# Patient Record
Sex: Female | Born: 1965 | Race: White | Hispanic: No | Smoking: Never smoker
Health system: Southern US, Community
[De-identification: ages and names within clinical notes are randomized; demographics above are authoritative.]

## PROBLEM LIST (undated history)

## (undated) DIAGNOSIS — I10 Essential (primary) hypertension: Secondary | ICD-10-CM

## (undated) DIAGNOSIS — E039 Hypothyroidism, unspecified: Secondary | ICD-10-CM

## (undated) DIAGNOSIS — G4733 Obstructive sleep apnea (adult) (pediatric): Secondary | ICD-10-CM

## (undated) HISTORY — DX: Obstructive sleep apnea (adult) (pediatric): G47.33

## (undated) HISTORY — DX: Hypothyroidism, unspecified: E03.9

## (undated) HISTORY — DX: Essential (primary) hypertension: I10

---

## 1999-03-12 ENCOUNTER — Other Ambulatory Visit: Admission: RE | Admit: 1999-03-12 | Discharge: 1999-03-12 | Payer: Self-pay | Admitting: Obstetrics and Gynecology

## 1999-09-16 ENCOUNTER — Encounter: Payer: Self-pay | Admitting: Urology

## 1999-09-16 ENCOUNTER — Encounter: Admission: RE | Admit: 1999-09-16 | Discharge: 1999-09-16 | Payer: Self-pay | Admitting: Urology

## 2000-04-22 ENCOUNTER — Other Ambulatory Visit: Admission: RE | Admit: 2000-04-22 | Discharge: 2000-04-22 | Payer: Self-pay | Admitting: Obstetrics and Gynecology

## 2001-05-18 ENCOUNTER — Other Ambulatory Visit: Admission: RE | Admit: 2001-05-18 | Discharge: 2001-05-18 | Payer: Self-pay | Admitting: Obstetrics and Gynecology

## 2002-06-29 ENCOUNTER — Other Ambulatory Visit: Admission: RE | Admit: 2002-06-29 | Discharge: 2002-06-29 | Payer: Self-pay | Admitting: Obstetrics and Gynecology

## 2002-11-07 ENCOUNTER — Ambulatory Visit (HOSPITAL_BASED_OUTPATIENT_CLINIC_OR_DEPARTMENT_OTHER): Admission: RE | Admit: 2002-11-07 | Discharge: 2002-11-07 | Payer: Self-pay | Admitting: Family Medicine

## 2003-12-05 ENCOUNTER — Other Ambulatory Visit: Admission: RE | Admit: 2003-12-05 | Discharge: 2003-12-05 | Payer: Self-pay | Admitting: *Deleted

## 2004-04-03 ENCOUNTER — Ambulatory Visit (HOSPITAL_COMMUNITY): Admission: RE | Admit: 2004-04-03 | Discharge: 2004-04-03 | Payer: Self-pay | Admitting: Gastroenterology

## 2004-12-06 ENCOUNTER — Ambulatory Visit: Payer: Self-pay | Admitting: Hematology & Oncology

## 2004-12-09 ENCOUNTER — Other Ambulatory Visit: Admission: RE | Admit: 2004-12-09 | Discharge: 2004-12-09 | Payer: Self-pay | Admitting: *Deleted

## 2005-07-03 ENCOUNTER — Ambulatory Visit: Payer: Self-pay | Admitting: Hematology & Oncology

## 2005-12-03 ENCOUNTER — Other Ambulatory Visit: Admission: RE | Admit: 2005-12-03 | Discharge: 2005-12-03 | Payer: Self-pay | Admitting: *Deleted

## 2007-03-11 ENCOUNTER — Other Ambulatory Visit: Admission: RE | Admit: 2007-03-11 | Discharge: 2007-03-11 | Payer: Self-pay | Admitting: Family Medicine

## 2007-10-22 ENCOUNTER — Emergency Department (HOSPITAL_COMMUNITY): Admission: EM | Admit: 2007-10-22 | Discharge: 2007-10-22 | Payer: Self-pay | Admitting: Emergency Medicine

## 2008-03-21 ENCOUNTER — Other Ambulatory Visit: Admission: RE | Admit: 2008-03-21 | Discharge: 2008-03-21 | Payer: Self-pay | Admitting: Family Medicine

## 2009-04-05 ENCOUNTER — Other Ambulatory Visit: Admission: RE | Admit: 2009-04-05 | Discharge: 2009-04-05 | Payer: Self-pay | Admitting: Family Medicine

## 2009-04-30 ENCOUNTER — Encounter: Admission: RE | Admit: 2009-04-30 | Discharge: 2009-04-30 | Payer: Self-pay | Admitting: Gastroenterology

## 2009-05-08 ENCOUNTER — Ambulatory Visit (HOSPITAL_COMMUNITY): Admission: RE | Admit: 2009-05-08 | Discharge: 2009-05-08 | Payer: Self-pay | Admitting: Gastroenterology

## 2009-10-19 ENCOUNTER — Encounter: Admission: RE | Admit: 2009-10-19 | Discharge: 2009-10-19 | Payer: Self-pay | Admitting: Family Medicine

## 2010-04-16 ENCOUNTER — Other Ambulatory Visit: Admission: RE | Admit: 2010-04-16 | Discharge: 2010-04-16 | Payer: Self-pay | Admitting: Family Medicine

## 2010-04-26 ENCOUNTER — Encounter: Admission: RE | Admit: 2010-04-26 | Discharge: 2010-04-26 | Payer: Self-pay | Admitting: Family Medicine

## 2011-01-17 NOTE — Op Note (Signed)
Nancy Kim, Nancy Kim                           ACCOUNT NO.:  [id number]   MEDICAL RECORD NO.:  [id number]                   PATIENT TYPE:  AMB   LOCATION:  [facility]                                 FACILITY:  [facility]   PHYSICIAN:  Graylin Shiver, M.D.                DATE OF BIRTH:  11-08-1965   DATE OF PROCEDURE:  04/03/2004  DATE OF DISCHARGE:                                 OPERATIVE REPORT   PROCEDURE PERFORMED:  Colonoscopy.   INDICATIONS FOR PROCEDURE:  Constipation, rectal bleeding.   Informed consent was obtained after explanation of the risks of bleeding,  infection, and perforation.   PREMEDICATIONS:  Fentanyl 50 mcg  IV, Versed 5 mg IV.   DESCRIPTION OF PROCEDURE:  With the patient in the left lateral decubitus  position, a rectal exam was performed and no masses were felt.  The Olympus  colonoscope was inserted into the rectum and advanced around the colon to  the cecum.  Cecal landmarks were identified.  The cecum and ascending colon  were normal.  The transverse colon normal.  The descending colon, sigmoid  and rectum were normal.  The patient tolerated the procedure well without  complications.   IMPRESSION:  Normal colonoscopy to the cecum.                                               Graylin Shiver, M.D.    Nancy Kim  D:  04/03/2004  T:  04/03/2004  Job:  [id number]   cc:   Deboraha Sprang at [facility]

## 2011-03-20 ENCOUNTER — Other Ambulatory Visit: Payer: Self-pay | Admitting: Family Medicine

## 2011-03-20 DIAGNOSIS — E041 Nontoxic single thyroid nodule: Secondary | ICD-10-CM

## 2011-03-27 ENCOUNTER — Ambulatory Visit
Admission: RE | Admit: 2011-03-27 | Discharge: 2011-03-27 | Disposition: A | Discharge status: Home / Self Care | Payer: BC Managed Care – PPO | Source: Ambulatory Visit | Attending: Family Medicine | Admitting: Family Medicine

## 2011-03-27 DIAGNOSIS — E041 Nontoxic single thyroid nodule: Secondary | ICD-10-CM

## 2011-05-26 LAB — BASIC METABOLIC PANEL
CO2: 21
Calcium: 10.3
Creatinine, Ser: 1.19
Glucose, Bld: 121 — ABNORMAL HIGH
Sodium: 139

## 2011-05-26 LAB — URINE CULTURE: Colony Count: 15000

## 2011-05-26 LAB — URINE MICROSCOPIC-ADD ON

## 2011-05-26 LAB — URINALYSIS, ROUTINE W REFLEX MICROSCOPIC
Bilirubin Urine: NEGATIVE
Glucose, UA: NEGATIVE
Hgb urine dipstick: NEGATIVE
Nitrite: NEGATIVE
Protein, ur: 100 — AB
Specific Gravity, Urine: 1.014
Urobilinogen, UA: 1
pH: 7.5

## 2011-08-13 ENCOUNTER — Other Ambulatory Visit: Payer: Self-pay | Admitting: Family Medicine

## 2011-08-13 ENCOUNTER — Other Ambulatory Visit (HOSPITAL_COMMUNITY)
Admission: RE | Admit: 2011-08-13 | Discharge: 2011-08-13 | Disposition: A | Discharge status: Home / Self Care | Payer: BC Managed Care – PPO | Source: Ambulatory Visit | Attending: Family Medicine | Admitting: Family Medicine

## 2011-08-13 DIAGNOSIS — Z1159 Encounter for screening for other viral diseases: Secondary | ICD-10-CM | POA: Insufficient documentation

## 2011-08-13 DIAGNOSIS — Z124 Encounter for screening for malignant neoplasm of cervix: Secondary | ICD-10-CM | POA: Insufficient documentation

## 2014-04-18 ENCOUNTER — Other Ambulatory Visit: Payer: Self-pay | Admitting: Family Medicine

## 2014-04-18 ENCOUNTER — Other Ambulatory Visit (HOSPITAL_COMMUNITY)
Admission: RE | Admit: 2014-04-18 | Discharge: 2014-04-18 | Disposition: A | Discharge status: Home / Self Care | Payer: BC Managed Care – PPO | Source: Ambulatory Visit | Attending: Family Medicine | Admitting: Family Medicine

## 2014-04-18 DIAGNOSIS — Z124 Encounter for screening for malignant neoplasm of cervix: Secondary | ICD-10-CM | POA: Diagnosis not present

## 2014-04-18 DIAGNOSIS — Z1151 Encounter for screening for human papillomavirus (HPV): Secondary | ICD-10-CM | POA: Insufficient documentation

## 2014-04-19 LAB — CYTOLOGY - PAP

## 2014-04-20 ENCOUNTER — Other Ambulatory Visit: Payer: Self-pay | Admitting: Family Medicine

## 2014-04-20 DIAGNOSIS — E041 Nontoxic single thyroid nodule: Secondary | ICD-10-CM

## 2014-04-26 ENCOUNTER — Ambulatory Visit
Admission: RE | Admit: 2014-04-26 | Discharge: 2014-04-26 | Disposition: A | Discharge status: Home / Self Care | Payer: BC Managed Care – PPO | Source: Ambulatory Visit | Attending: Family Medicine | Admitting: Family Medicine

## 2014-04-26 ENCOUNTER — Encounter (INDEPENDENT_AMBULATORY_CARE_PROVIDER_SITE_OTHER): Payer: Self-pay

## 2014-04-26 DIAGNOSIS — E041 Nontoxic single thyroid nodule: Secondary | ICD-10-CM

## 2014-08-22 ENCOUNTER — Other Ambulatory Visit: Payer: Self-pay | Admitting: Endocrinology

## 2014-08-22 DIAGNOSIS — E041 Nontoxic single thyroid nodule: Secondary | ICD-10-CM

## 2015-04-09 ENCOUNTER — Ambulatory Visit
Admission: RE | Admit: 2015-04-09 | Discharge: 2015-04-09 | Disposition: A | Discharge status: Home / Self Care | Payer: BC Managed Care – PPO | Source: Ambulatory Visit | Attending: Endocrinology | Admitting: Endocrinology

## 2015-04-09 DIAGNOSIS — E041 Nontoxic single thyroid nodule: Secondary | ICD-10-CM

## 2016-10-22 ENCOUNTER — Other Ambulatory Visit: Payer: Self-pay | Admitting: Family Medicine

## 2016-10-22 ENCOUNTER — Other Ambulatory Visit (HOSPITAL_COMMUNITY)
Admission: RE | Admit: 2016-10-22 | Discharge: 2016-10-22 | Disposition: A | Discharge status: Home / Self Care | Payer: BC Managed Care – PPO | Source: Ambulatory Visit | Attending: Family Medicine | Admitting: Family Medicine

## 2016-10-22 DIAGNOSIS — Z124 Encounter for screening for malignant neoplasm of cervix: Secondary | ICD-10-CM | POA: Diagnosis present

## 2016-10-22 DIAGNOSIS — Z1151 Encounter for screening for human papillomavirus (HPV): Secondary | ICD-10-CM | POA: Insufficient documentation

## 2016-10-24 LAB — CYTOLOGY - PAP
DIAGNOSIS: NEGATIVE
HPV: NOT DETECTED

## 2019-10-29 ENCOUNTER — Ambulatory Visit: Payer: BC Managed Care – PPO | Attending: Internal Medicine

## 2019-10-29 DIAGNOSIS — Z23 Encounter for immunization: Secondary | ICD-10-CM | POA: Insufficient documentation

## 2019-10-29 NOTE — Progress Notes (Signed)
   Covid-19 Vaccination Clinic  Name:  Nancy Kim    MRN: [id number] DOB: 12-Jun-1966  10/29/2019  Ms. Nancy Kim was observed post Covid-19 immunization for 15 minutes without incidence. She was provided with Vaccine Information Sheet and instruction to access the V-Safe system.   Ms. Nancy Kim was instructed to call 911 with any severe reactions post vaccine: Marland Kitchen Difficulty breathing  . Swelling of your face and throat  . A fast heartbeat  . A bad rash all over your body  . Dizziness and weakness    Immunizations Administered    Name Date Dose VIS Date Route   Pfizer COVID-19 Vaccine 10/29/2019 12:45 PM 0.3 mL 08/12/2019 Intramuscular   Manufacturer: [employer], [employer]   Lot: [id number]   NDC: [address]-8

## 2019-11-19 ENCOUNTER — Ambulatory Visit: Payer: BC Managed Care – PPO | Attending: Internal Medicine

## 2019-11-19 DIAGNOSIS — Z23 Encounter for immunization: Secondary | ICD-10-CM

## 2019-11-19 NOTE — Progress Notes (Signed)
   Covid-19 Vaccination Clinic  Name:  Nancy Kim    MRN: [id number] DOB: [birth year]  11/19/2019  [name] was observed post Covid-19 immunization for 15 minutes without incident. She was provided with Vaccine Information Sheet and instruction to access the V-Safe system.   [name] was instructed to call 911 with any severe reactions post vaccine: Marland Kitchen Difficulty breathing  . Swelling of face and throat  . A fast heartbeat  . A bad rash all over body  . Dizziness and weakness   Immunizations Administered    Name Date Dose VIS Date Route   Pfizer COVID-19 Vaccine 11/19/2019  9:26 AM 0.3 mL 08/12/2019 Intramuscular   Manufacturer: [employer], [employer]   Lot: [id number]   NDC: [address]-7

## 2019-11-23 ENCOUNTER — Ambulatory Visit: Payer: BC Managed Care – PPO

## 2020-03-03 ENCOUNTER — Emergency Department (HOSPITAL_COMMUNITY)
Admission: EM | Admit: 2020-03-03 | Discharge: 2020-03-04 | Disposition: A | Discharge status: Home / Self Care | Payer: BC Managed Care – PPO | Attending: Emergency Medicine | Admitting: Emergency Medicine

## 2020-03-03 DIAGNOSIS — K529 Noninfective gastroenteritis and colitis, unspecified: Secondary | ICD-10-CM | POA: Diagnosis not present

## 2020-03-03 DIAGNOSIS — R112 Nausea with vomiting, unspecified: Secondary | ICD-10-CM | POA: Diagnosis present

## 2020-03-03 LAB — CBC WITH DIFFERENTIAL/PLATELET
Abs Immature Granulocytes: 0.32 10*3/uL — ABNORMAL HIGH (ref 0.00–0.07)
Basophils Absolute: 0.1 10*3/uL (ref 0.0–0.1)
Basophils Relative: 0 %
Eosinophils Absolute: 0.2 10*3/uL (ref 0.0–0.5)
Eosinophils Relative: 1 %
HCT: 52.4 % — ABNORMAL HIGH (ref 36.0–46.0)
Hemoglobin: 17.3 g/dL — ABNORMAL HIGH (ref 12.0–15.0)
Immature Granulocytes: 1 %
Lymphocytes Relative: 7 %
Lymphs Abs: 1.8 10*3/uL (ref 0.7–4.0)
MCH: 28.8 pg (ref 26.0–34.0)
MCHC: 33 g/dL (ref 30.0–36.0)
MCV: 87.3 fL (ref 80.0–100.0)
Monocytes Absolute: 0.9 10*3/uL (ref 0.1–1.0)
Monocytes Relative: 3 %
Neutro Abs: 24.4 10*3/uL — ABNORMAL HIGH (ref 1.7–7.7)
Neutrophils Relative %: 88 %
Platelets: 443 10*3/uL — ABNORMAL HIGH (ref 150–400)
RBC: 6 MIL/uL — ABNORMAL HIGH (ref 3.87–5.11)
RDW: 13 % (ref 11.5–15.5)
WBC: 27.7 10*3/uL — ABNORMAL HIGH (ref 4.0–10.5)
nRBC: 0 % (ref 0.0–0.2)

## 2020-03-03 LAB — COMPREHENSIVE METABOLIC PANEL
ALT: 17 U/L (ref 0–44)
AST: 28 U/L (ref 15–41)
Albumin: 4.3 g/dL (ref 3.5–5.0)
Alkaline Phosphatase: 85 U/L (ref 38–126)
Anion gap: 15 (ref 5–15)
BUN: 26 mg/dL — ABNORMAL HIGH (ref 6–20)
CO2: 22 mmol/L (ref 22–32)
Calcium: 9.6 mg/dL (ref 8.9–10.3)
Chloride: 102 mmol/L (ref 98–111)
Creatinine, Ser: 1.35 mg/dL — ABNORMAL HIGH (ref 0.44–1.00)
GFR calc Af Amer: 51 mL/min — ABNORMAL LOW (ref >60)
GFR calc non Af Amer: 44 mL/min — ABNORMAL LOW (ref >60)
Glucose, Bld: 194 mg/dL — ABNORMAL HIGH (ref 70–99)
Potassium: 4.1 mmol/L (ref 3.5–5.1)
Sodium: 139 mmol/L (ref 135–145)
Total Bilirubin: 1.6 mg/dL — ABNORMAL HIGH (ref 0.3–1.2)
Total Protein: 7.5 g/dL (ref 6.5–8.1)

## 2020-03-03 MED ORDER — SODIUM CHLORIDE 0.9 % IV BOLUS
1000.0000 mL | Freq: Once | INTRAVENOUS | Status: AC
  Administered 2020-03-03: 1000 mL via INTRAVENOUS

## 2020-03-03 MED ORDER — ONDANSETRON HCL 4 MG/2ML IJ SOLN
4.0000 mg | Freq: Once | INTRAMUSCULAR | Status: AC
  Administered 2020-03-03: 4 mg via INTRAVENOUS
  Filled 2020-03-03: qty 2

## 2020-03-03 NOTE — ED Provider Notes (Signed)
[facility]-EMERGENCY DEPT Provider Note   CSN: [id number] Arrival date & time: 03/03/20  2117     History No chief complaint on file.   Nancy Kim is a 54 y.o. female.  Patient presents to the emergency department with chief complaint of nausea, vomiting, and diarrhea.  She reports that the symptoms started suddenly today at around 7 PM.  She reports history of the same.  She denies any unusual oral intake.  She denies any fevers or chills.  She denies any recent illnesses or other associated symptoms or problems.  Denies any treatments prior to arrival.  No one else is sick at home.  She reports recent abx use for a dental procedure, but can't recall which one.  She does have amoxicillin allergy.  The history is provided by the patient. No language interpreter was used.       No past medical history on file.  There are no problems to display for this patient.    OB History   No obstetric history on file.     No family history on file.  Social History   Tobacco Use  . Smoking status: Not on file  Substance Use Topics  . Alcohol use: Not on file  . Drug use: Not on file    Home Medications Prior to Admission medications   Not on File    Allergies    Patient has no allergy information on record.  Review of Systems   Review of Systems  All other systems reviewed and are negative.   Physical Exam Updated Vital Signs BP 139/90 (BP Location: Right Arm)   Pulse 71   Temp (!) 97.5 F (36.4 C) (Oral)   Resp 20   Ht 5\' 4"  (1.626 m)   Wt 86.2 kg   SpO2 100%   BMI 32.61 kg/m   Physical Exam Vitals and nursing note reviewed.  Constitutional:      General: She is not in acute distress.    Appearance: She is well-developed.  HENT:     Head: Normocephalic and atraumatic.  Eyes:     Conjunctiva/sclera: Conjunctivae normal.  Cardiovascular:     Rate and Rhythm: Normal rate and regular rhythm.     Heart sounds: No murmur heard.    Pulmonary:     Effort: Pulmonary effort is normal. No respiratory distress.     Breath sounds: Normal breath sounds.  Abdominal:     Palpations: Abdomen is soft.     Tenderness: There is no abdominal tenderness.  Musculoskeletal:        General: Normal range of motion.     Cervical back: Neck supple.  Skin:    General: Skin is warm and dry.  Neurological:     Mental Status: She is alert and oriented to person, place, and time.  Psychiatric:        Mood and Affect: Mood normal.        Behavior: Behavior normal.     ED Results / Procedures / Treatments   Labs (all labs ordered are listed, but only abnormal results are displayed) Labs Reviewed  CBC WITH DIFFERENTIAL/PLATELET  COMPREHENSIVE METABOLIC PANEL    EKG None  Radiology No results found.  Procedures Procedures (including critical care time)  Medications Ordered in ED Medications  sodium chloride 0.9 % bolus 1,000 mL (has no administration in time range)  ondansetron (ZOFRAN) injection 4 mg (has no administration in time range)    ED Course  I have reviewed the triage vital signs and the nursing notes.  Pertinent labs & imaging results that were available during my care of the patient were reviewed by me and considered in my medical decision making (see chart for details).    MDM Rules/Calculators/A&P                          This patient complains of nausea, vomiting, and diarrhea, this involves an extensive number of treatment options, and is a complaint that carries with it a high risk of complications and morbidity.  The differential diagnosis includes viral illness, colitis, C. difficile given recent antibiotic use.  Pertinent Labs I ordered, reviewed, and interpreted labs, which included CBC notable for significant leukocytosis at 27.7, she also appears hemoconcentrated with hemoglobin of 17.3.  CMP notable for mildly elevated creatinine at 1.35, likely secondary to dehydration.  Potassium is  normal.  C. difficile is negative.  Imaging Interpretation I ordered imaging studies which included CT,  which showed evidence for enterocolitis..   Medications I ordered medication Zofran and fluids for nausea and dehydration.  Reassessments After the interventions stated above, I reevaluated the patient and found feeling improved after fluids and Zofran.  With reassuring work-up, will plan for discharge and outpatient follow-up.  Patient understands and agrees with plan.  Final Clinical Impression(s) / ED Diagnoses Final diagnoses:  Enterocolitis    Rx / DC Orders ED Discharge Orders    None       Roxy Horseman, PA-C 03/04/20 0556    Palumbo, April, MD 03/04/20 4497

## 2020-03-03 NOTE — ED Triage Notes (Signed)
Per EMS, Pt is coming from home. PT called out today due to sudden onset of N/V/D starting at 1900 today. Pt was found very pale, and diaphoretic. Pt has had these episode before but no explantation found.

## 2020-03-04 ENCOUNTER — Encounter (HOSPITAL_COMMUNITY): Payer: Self-pay

## 2020-03-04 ENCOUNTER — Emergency Department (HOSPITAL_COMMUNITY): Payer: BC Managed Care – PPO

## 2020-03-04 LAB — C DIFFICILE QUICK SCREEN W PCR REFLEX
C Diff antigen: NEGATIVE
C Diff interpretation: NOT DETECTED
C Diff toxin: NEGATIVE

## 2020-03-04 MED ORDER — SODIUM CHLORIDE (PF) 0.9 % IJ SOLN
INTRAMUSCULAR | Status: AC
  Filled 2020-03-04: qty 50

## 2020-03-04 MED ORDER — IOHEXOL 300 MG/ML  SOLN
100.0000 mL | Freq: Once | INTRAMUSCULAR | Status: AC | PRN
  Administered 2020-03-04: 80 mL via INTRAVENOUS

## 2020-03-04 MED ORDER — ONDANSETRON 4 MG PO TBDP: 4.0000 mg | ORAL_TABLET | Freq: Three times a day (TID) | ORAL | 0 refills | Status: DC | PRN

## 2020-03-20 ENCOUNTER — Other Ambulatory Visit (HOSPITAL_COMMUNITY)
Admission: RE | Admit: 2020-03-20 | Discharge: 2020-03-20 | Disposition: A | Discharge status: Home / Self Care | Payer: BC Managed Care – PPO | Source: Ambulatory Visit | Attending: Family Medicine | Admitting: Family Medicine

## 2020-03-20 DIAGNOSIS — Z124 Encounter for screening for malignant neoplasm of cervix: Secondary | ICD-10-CM | POA: Diagnosis present

## 2020-03-23 LAB — CYTOLOGY - PAP
Comment: NEGATIVE
Diagnosis: NEGATIVE
Diagnosis: REACTIVE
High risk HPV: NEGATIVE

## 2021-02-12 IMAGING — CT CT ABD-PELV W/ CM
2 of 5 series · 15 of 46 positions shown, 17 images · IV contrast (omnipaque)
Comparison: HIDA 05/08/2009, upper GI 02/28/2009

CLINICAL DATA: Sudden onset nausea, vomiting and diarrhea began at
6588 hours, pale and diaphoretic

EXAM:
CT ABDOMEN AND PELVIS WITH CONTRAST
TECHNIQUE: Multidetector CT imaging of the abdomen and pelvis was performed
using the standard protocol following bolus administration of
intravenous contrast.
CONTRAST:  80mL OMNIPAQUE IOHEXOL 300 MG/ML  SOLN

[Series 2: axial st · axial · 0.85mm/px · z∈[-494,-79]mm · 12 of 97 slices shown, 14 images]
[im 7/97  soft-tissue]
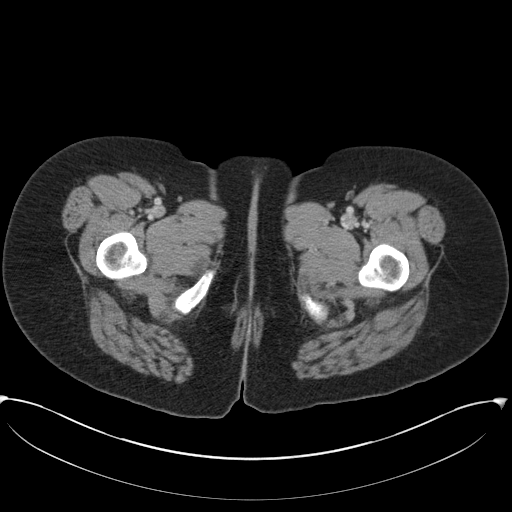
[im 7/97  bone]
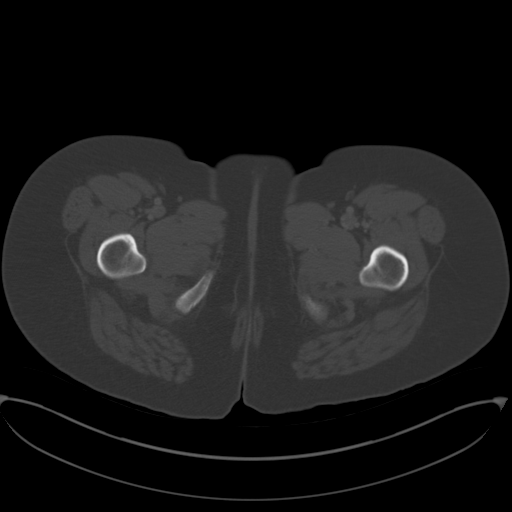
[im 14/97  soft-tissue]
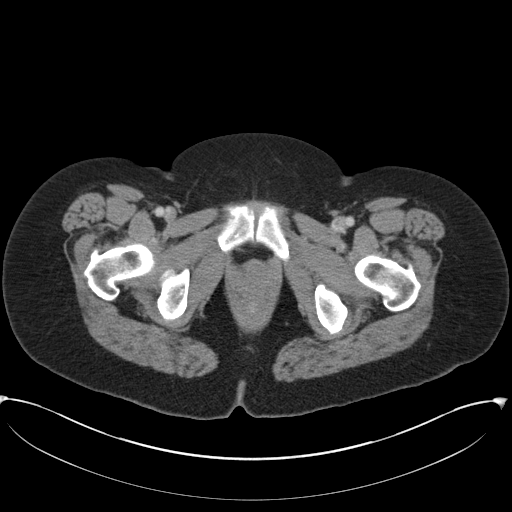
[im 21/97  soft-tissue]
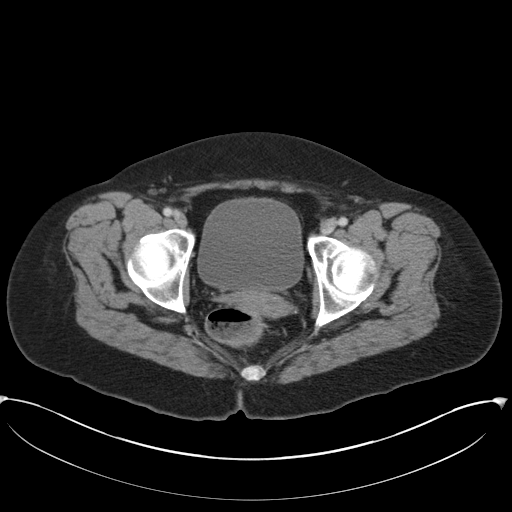
[im 28/97  soft-tissue]
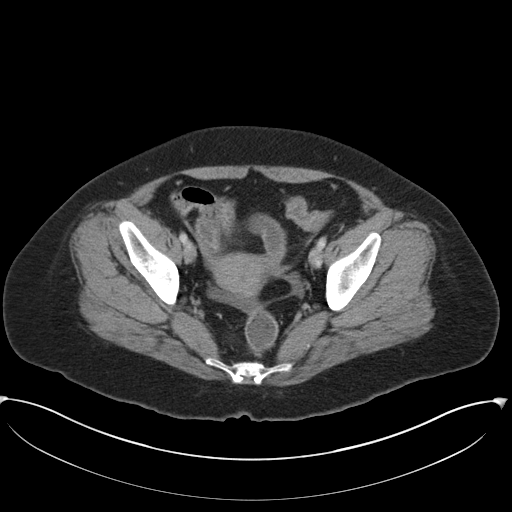
[im 35/97  soft-tissue]
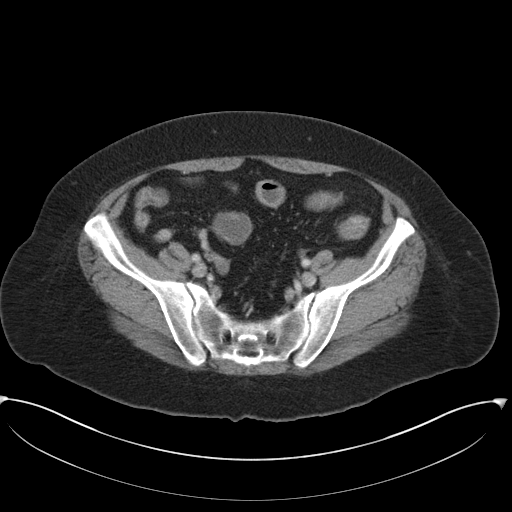
[im 42/97  soft-tissue]
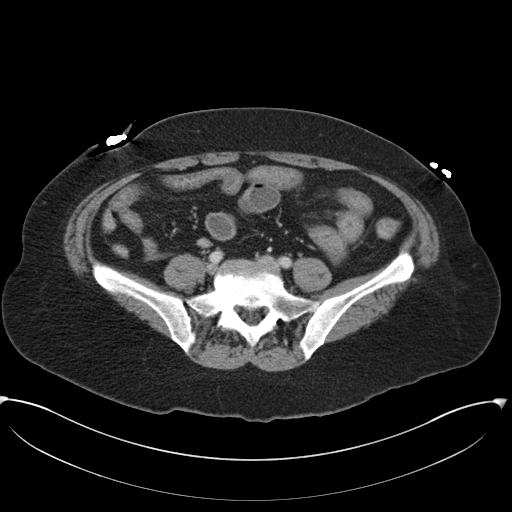
[im 55/97  soft-tissue]
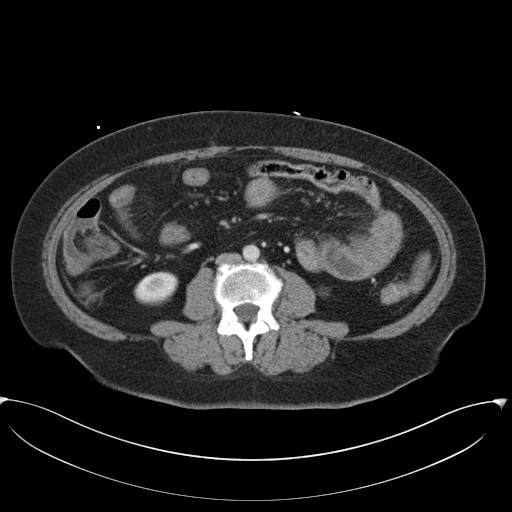
[im 62/97  soft-tissue]
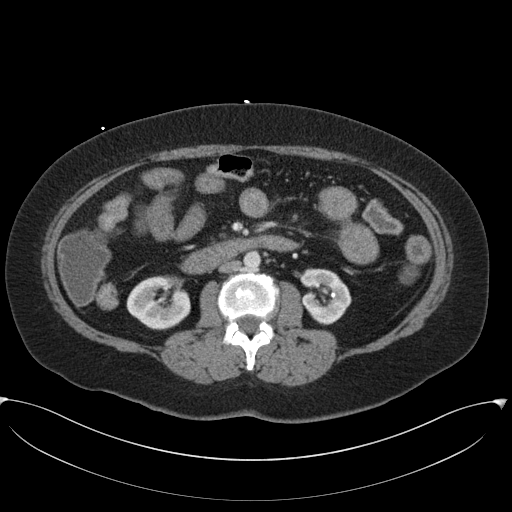
[im 69/97  soft-tissue]
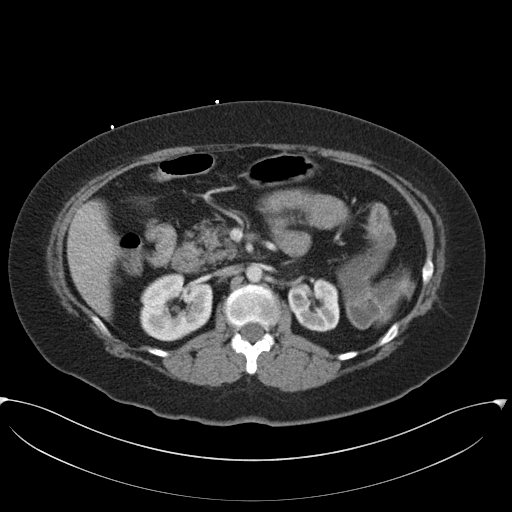
[im 69/97  bone]
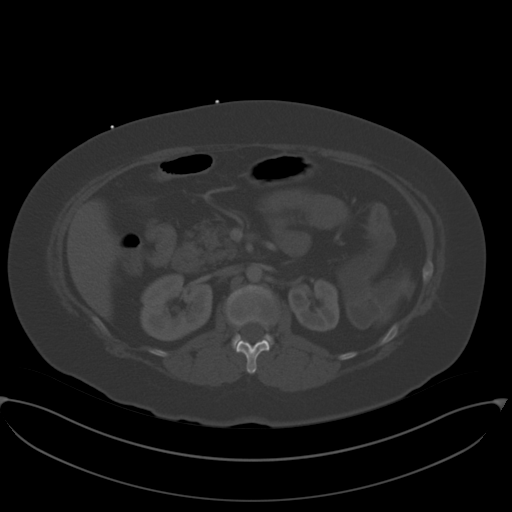
[im 76/97  soft-tissue]
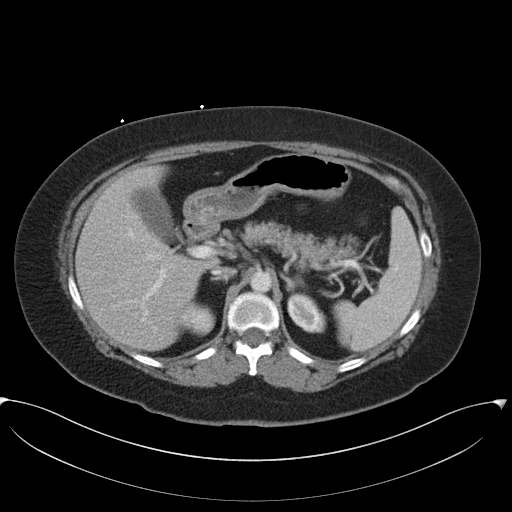
[im 83/97  soft-tissue]
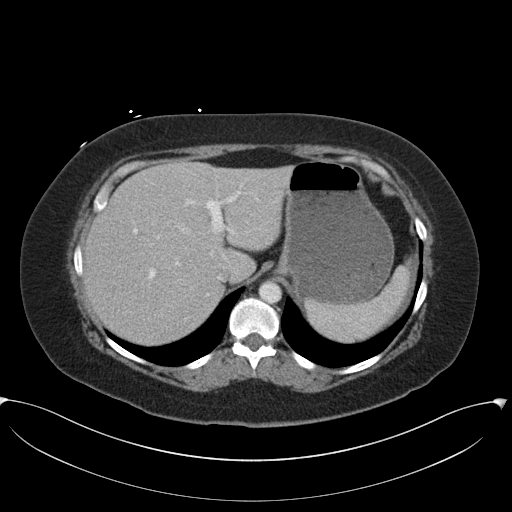
[im 90/97  soft-tissue]
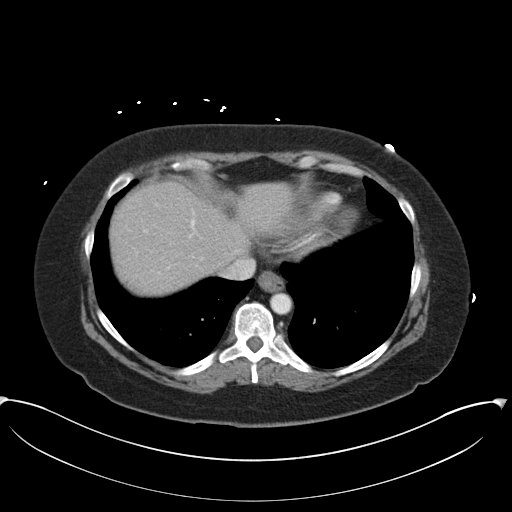

[Series 5: coronal st · coronal · 0.82mm/px · 3 of 144 slices shown]
[im 48/144  soft-tissue]
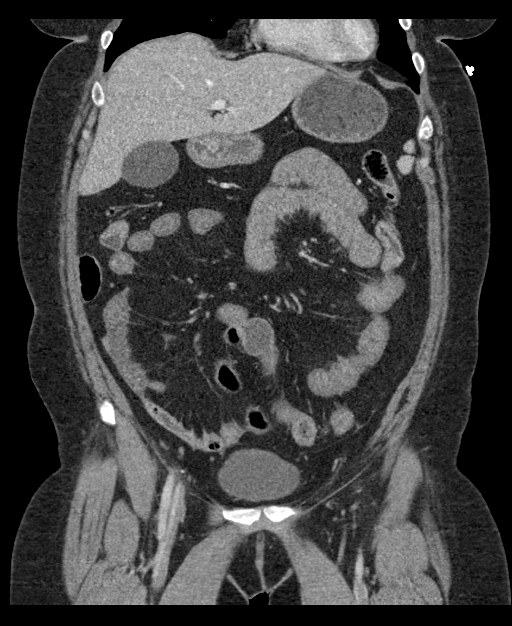
[im 64/144  soft-tissue]
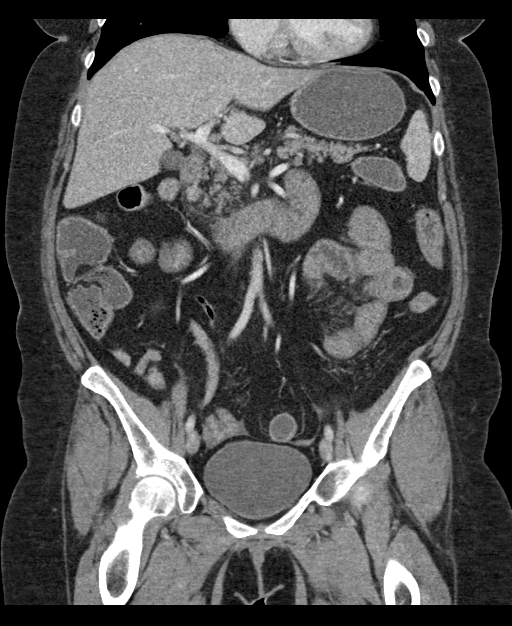
[im 80/144  soft-tissue]
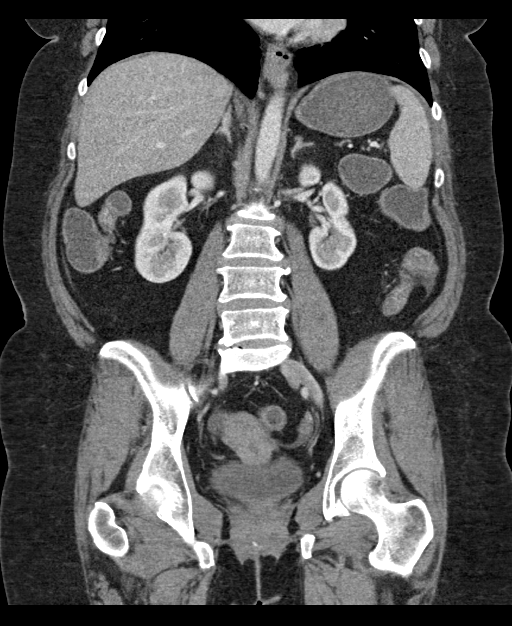

[15 of 46 positions shown; findings below may reference images not displayed]

FINDINGS: Lower chest: Lung bases are clear. Normal heart size. No pericardial
effusion.

Hepatobiliary: No worrisome focal liver abnormality is seen. Normal
gallbladder. No visible calcified gallstones. No biliary ductal
dilatation.

Pancreas: Partial fatty replacement of the pancreas. No pancreatic
ductal dilatation or surrounding inflammatory changes.

Spleen: Normal in size without focal abnormality.

Adrenals/Urinary Tract: Normal adrenal glands. Kidneys enhance and
excrete symmetrically and uniformly. Left kidney is somewhat
diminutive compared to the right with areas of cortical scarring.
Multiple nonobstructing calculi are present in the left kidney,
largest measuring up to 7 mm in size. No obstructive urolithiasis or
hydronephrosis. Urinary bladder is unremarkable.

Stomach/Bowel: Distal esophagus, stomach and duodenum are
unremarkable. There is diffuse mild edematous mural thickening
throughout much of the small bowel involving both jejunum and ileum.
A normal appendix is visualized. Much of the colon is fluid-filled
with mild edematous mural changes and mucosal hyperemia as well.
Extensive reactive appearing adenopathy present in the central
mesentery. Some faint associated hazy stranding within the
mesenteric leaflets. No evidence of bowel obstruction.

Vascular/Lymphatic: No significant vascular findings are present.
Reactive mesenteric adenopathy, as above. No pathologically enlarged
abdominal or pelvic lymph nodes.

Reproductive: Uterus and bilateral adnexa are unremarkable.

Other: Trace low-attenuation free fluid in the pelvis may be
reactive or physiologic in a pre/perimenopausal female. No
abdominopelvic free air. No bowel containing hernias.

Musculoskeletal: Mild degenerative changes in the spine. No acute
osseous abnormality or suspicious osseous lesion.
IMPRESSION: 1. Diffuse mild edematous mural thickening throughout much of the
small bowel and colon with some reactive mesenteric adenopathy.
Findings are suggestive of an enterocolitis, either infectious or
inflammatory. Lack of formed stool within the colon is compatible
with diarrheal illness/rapid transit state.
2. Trace low-attenuation free fluid in the pelvis may be reactive or
physiologic in a pre/perimenopausal female.
3. Slightly asymmetric left renal scarring with multiple
nonobstructing left renal calculi, largest measuring up to 7 mm in
size.

## 2024-05-23 ENCOUNTER — Telehealth: Payer: Self-pay | Admitting: Family Medicine

## 2024-05-23 NOTE — Telephone Encounter (Signed)
 I contacted Nancy Kim to inform her that we nee a referral to schedule her as a high risk breast patient.

## 2024-05-23 NOTE — Telephone Encounter (Signed)
 Dajah called in to be scheduled for Hematology. We do not have a referral for her.

## 2024-05-23 NOTE — Telephone Encounter (Signed)
 Livy stated that she has been referred to us  for an initial consult for a High Risk breast appointment and she would like to see Dr. Lanny. Aileana would like to know if we take her insurance and how to schedule a consultation.

## 2024-06-13 ENCOUNTER — Inpatient Hospital Stay: Attending: Hematology | Admitting: Hematology

## 2024-06-13 ENCOUNTER — Other Ambulatory Visit

## 2024-06-13 ENCOUNTER — Encounter: Payer: Self-pay | Admitting: Hematology

## 2024-06-13 VITALS — BP 138/88 | HR 94 | Temp 98.6°F | Resp 17 | Wt 198.2 lb

## 2024-06-13 DIAGNOSIS — Z803 Family history of malignant neoplasm of breast: Secondary | ICD-10-CM | POA: Insufficient documentation

## 2024-06-13 DIAGNOSIS — R9230 Dense breasts, unspecified: Secondary | ICD-10-CM | POA: Diagnosis not present

## 2024-06-13 DIAGNOSIS — E039 Hypothyroidism, unspecified: Secondary | ICD-10-CM | POA: Insufficient documentation

## 2024-06-13 DIAGNOSIS — Z79890 Hormone replacement therapy (postmenopausal): Secondary | ICD-10-CM | POA: Insufficient documentation

## 2024-06-13 DIAGNOSIS — G4730 Sleep apnea, unspecified: Secondary | ICD-10-CM | POA: Diagnosis not present

## 2024-06-13 DIAGNOSIS — I10 Essential (primary) hypertension: Secondary | ICD-10-CM | POA: Diagnosis not present

## 2024-06-13 DIAGNOSIS — F32A Depression, unspecified: Secondary | ICD-10-CM | POA: Diagnosis not present

## 2024-06-13 DIAGNOSIS — Z780 Asymptomatic menopausal state: Secondary | ICD-10-CM | POA: Insufficient documentation

## 2024-06-13 DIAGNOSIS — Z1379 Encounter for other screening for genetic and chromosomal anomalies: Secondary | ICD-10-CM | POA: Insufficient documentation

## 2024-06-13 DIAGNOSIS — Z9189 Other specified personal risk factors, not elsewhere classified: Secondary | ICD-10-CM | POA: Insufficient documentation

## 2024-06-13 NOTE — Progress Notes (Signed)
 [facility]   Telephone:([phone]) [phone] [phone]   Clinic New Consult Note   Patient Care Team: Rankins, Richerd SAUNDERS, MD as PCP - General (Family Medicine) 06/13/2024  CHIEF COMPLAINTS/PURPOSE OF CONSULTATION:  High risk for breast cancer   REFERRING PHYSICIAN: Rankins, Richerd SAUNDERS, MD   Discussed the use of AI scribe software for clinical note transcription with the patient, who gave verbal consent to proceed.  History of Present Illness Nancy Kim is a 58 year old female who presents for a high-risk breast cancer consultation.  She is considered high-risk for breast cancer based on a family history of the disease, with her mother and maternal grandmother having had breast cancer. Her mother underwent surgery and radiation treatment. She has dense breast tissue noted in previous mammograms and has not undergone genetic testing for breast cancer-related mutations. Her recent mammogram was normal, and she has not had any breast biopsies or other cancers such as ovarian, colorectal, or uterine cancer.  Her medical history includes high blood pressure managed with lisinopril, sleep apnea managed with a CPAP machine, and hypothyroidism treated with Synthroid. She is postmenopausal, having gone through menopause at age 24. She is a non-smoker, does not consume alcohol or use drugs, and is actively trying to maintain a healthy lifestyle by walking five hours a week and eating a diet rich in fruits and vegetables.     MEDICAL HISTORY:  Past Medical History:  Diagnosis Date   HTN (hypertension)    Hypothyroidism    OSA (obstructive sleep apnea)     SURGICAL HISTORY: History reviewed. No pertinent surgical history.  SOCIAL HISTORY: Social History   Socioeconomic History   Marital status: Divorced    Spouse name: Not on file   Number of children: 1   Years of education: Not on file   Highest education level: Not on file  Occupational History   Not on  file  Tobacco Use   Smoking status: Never   Smokeless tobacco: Never  Substance and Sexual Activity   Alcohol use: Never   Drug use: Never   Sexual activity: Not on file  Other Topics Concern   Not on file  Social History Narrative   Not on file   Social Drivers of Health   Financial Resource Strain: Not on file  Food Insecurity: No Food Insecurity (06/13/2024)   Hunger Vital Sign    Worried About Running Out of Food in the Last Year: Never true    Ran Out of Food in the Last Year: Never true  Transportation Needs: No Transportation Needs (06/13/2024)   PRAPARE - Administrator, Civil Service (Medical): No    Lack of Transportation (Non-Medical): No  Physical Activity: Not on file  Stress: Not on file  Social Connections: Not on file  Intimate Partner Violence: Not At Risk (06/13/2024)   Humiliation, Afraid, Rape, and Kick questionnaire    Fear of Current or Ex-Partner: No    Emotionally Abused: No    Physically Abused: No    Sexually Abused: No    FAMILY HISTORY: Family History  Problem Relation Age of Onset   Cancer Mother        possible breast cancer   Cancer Maternal Grandmother        possible breast cancer    ALLERGIES:  has no known allergies.  MEDICATIONS:  Current Outpatient Medications  Medication Sig Dispense Refill   levothyroxine (SYNTHROID) 50 MCG tablet Take 50 mcg by mouth  daily.     lisinopril (ZESTRIL) 10 MG tablet Take 10 mg by mouth daily.     No current facility-administered medications for this visit.    REVIEW OF SYSTEMS:   Constitutional: Denies fevers, chills or abnormal night sweats Eyes: Denies blurriness of vision, double vision or watery eyes Ears, nose, mouth, throat, and face: Denies mucositis or sore throat Respiratory: Denies cough, dyspnea or wheezes Cardiovascular: Denies palpitation, chest discomfort or lower extremity swelling Gastrointestinal:  Denies nausea, heartburn or change in bowel habits Skin:  Denies abnormal skin rashes Lymphatics: Denies new lymphadenopathy or easy bruising Neurological:Denies numbness, tingling or new weaknesses Behavioral/Psych: Mood is stable, no new changes  All other systems were reviewed with the patient and are negative.  PHYSICAL EXAMINATION: ECOG PERFORMANCE STATUS: 0 - Asymptomatic  Vitals:   06/13/24 1447  BP: 138/88  Pulse: 94  Resp: 17  Temp: 98.6 F (37 C)  SpO2: 96%   Filed Weights   06/13/24 1447  Weight: 198 lb 3.2 oz (89.9 kg)    GENERAL:alert, no distress and comfortable SKIN: skin color, texture, turgor are normal, no rashes or significant lesions EYES: normal, conjunctiva are pink and non-injected, sclera clear OROPHARYNX:no exudate, no erythema and lips, buccal mucosa, and tongue normal  NECK: supple, thyroid  normal size, non-tender, without nodularity LYMPH:  no palpable lymphadenopathy in the cervical, axillary or inguinal LUNGS: clear to auscultation and percussion with normal breathing effort HEART: regular rate & rhythm and no murmurs and no lower extremity edema ABDOMEN:abdomen soft, non-tender and normal bowel sounds Musculoskeletal:no cyanosis of digits and no clubbing  PSYCH: alert & oriented x 3 with fluent speech NEURO: no focal motor/sensory deficits  Physical Exam MEASUREMENTS: Height- 5'4, Weight- 198.  LABORATORY DATA:  I have reviewed the data as listed    Latest Ref Rng & Units 03/03/2020   10:42 PM  CBC  WBC 4.0 - 10.5 K/uL 27.7   Hemoglobin 12.0 - 15.0 g/dL 82.6   Hematocrit 63.9 - 46.0 % 52.4   Platelets 150 - 400 K/uL 443       Latest Ref Rng & Units 03/03/2020   10:42 PM 10/22/2007    1:26 PM  CMP  Glucose 70 - 99 mg/dL 805  878   BUN 6 - 20 mg/dL 26  18   Creatinine 9.55 - 1.00 mg/dL 8.64  8.80   Sodium 864 - 145 mmol/L 139  139   Potassium 3.5 - 5.1 mmol/L 4.1  4.6   Chloride 98 - 111 mmol/L 102  105   CO2 22 - 32 mmol/L 22  21   Calcium 8.9 - 10.3 mg/dL 9.6  89.6   Total Protein 6.5  - 8.1 g/dL 7.5    Total Bilirubin 0.3 - 1.2 mg/dL 1.6    Alkaline Phos 38 - 126 U/L 85    AST 15 - 41 U/L 28    ALT 0 - 44 U/L 17       RADIOGRAPHIC STUDIES: I have personally reviewed the radiological images as listed and agreed with the findings in the report. No results found.   Assessment & Plan High risk for breast cancer 58 year old female identified as high risk for breast cancer with a 28.8% risk based on the Tyrer-Cuzick risk calculator (mainly from her family history).  She is postmenopausal with no genetic testing yet. - Discuss lifestyle modifications including healthy diet, regular exercise, and weight management. - Recommend a low-carb diet with lean meats, more vegetables, and fruits, avoiding  preserved foods. - Discuss free genetic testing for BRCA1, BRCA2, Lynch syndrome, and cholesterol genes through [facility] at [facility], or through our genetic counselor. - Due to her dense breast tissue, I recommend contrast-enhanced mammogram for better screening due to dense breast tissue. - Advise against alcohol consumption except for special occasions. - Encourage weight-bearing exercises to improve bone density and overall health. -I think the benefit of chemoprevention with tamoxifen is uncertain in her case.   Hypertension Hypertension well-managed with medication. - Continue current antihypertensive medication.  Hypothyroidism Hypothyroidism managed with Synthroid.  Sleep apnea Sleep apnea managed with CPAP machine for 10 to 15 years. - Continue use of CPAP machine.  Depression Depression with recent discontinuation of Effexor.  Plan -using the Tyrer-Cuzick risk calculator, her lifetime risk of breast cancer is 28%, which is considered high risk. - Discussed lifestyle modification and breast cancer screening through contrast-enhanced mammogram - I recommend genetic testing - I will see her as needed in future.   No orders of the defined types were placed  in this encounter.   All questions were answered. The patient knows to call the clinic with any problems, questions or concerns. I spent 40 minutes counseling the patient face to face. The total time spent in the appointment was 50 minutes including review of chart and various tests results, discussions about plan of care and coordination of care plan.     Onita Mattock, MD 06/13/2024

## 2024-08-01 ENCOUNTER — Telehealth: Payer: Self-pay | Admitting: Diagnostic Neuroimaging

## 2024-08-01 NOTE — Telephone Encounter (Signed)
 Inquiry about an earlier appointment

## 2024-08-22 ENCOUNTER — Encounter: Payer: Self-pay | Admitting: Diagnostic Neuroimaging

## 2024-08-22 ENCOUNTER — Ambulatory Visit: Admitting: Diagnostic Neuroimaging

## 2024-08-22 VITALS — BP 120/74 | HR 74 | Ht 64.0 in | Wt 190.0 lb

## 2024-08-22 DIAGNOSIS — R4689 Other symptoms and signs involving appearance and behavior: Secondary | ICD-10-CM

## 2024-08-22 NOTE — Progress Notes (Signed)
 "  [facility]  PATIENT: Nancy Kim DOB: 03-Feb-1966  REFERRING CLINICIAN: Cleotilde Planas, MD HISTORY FROM: patient  REASON FOR VISIT: new consult   HISTORICAL  CHIEF COMPLAINT:  Chief Complaint  Patient presents with   RM 6     Patient is here for recurrent vomiting - started 17 years ago. Once every 9 months has a weird sensation in her stomach with severe pain and diarrhea and its nonstop. Gets hot and sweaty and feels fatigue from the episode and later comes with the vomiting. She also has shaking with chills. Will lay on the floor for 3-4 hours before being able to get to the bed. Has been to the hospital twice and [place] did labs in 2021. Has been seen by gastro as well last month as a consult.    Procedure    Has had a colonospy and endoscopy last Wednesday. Has not used the bathroom consistently. She take OTC miralx on the days she does not have a bowel movement.  Has been constant since colonoscopy maybe due the prep. Makes her own protein bars and that helps some     HISTORY OF PRESENT ILLNESS:   58 year old female here for evaluation of recurrent diarrhea, nausea and vomiting.  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  She had to go to the bathroom and had sudden severe diarrhea.  She felt hot and sweating.  This was followed by significant nausea and vomiting.  She had several hours of malaise and weakness.  Eventually by the next day she recovered.  She started to have recurrent episodes of these symptoms approximately every 9 months since that time.  No specific triggering or aggravating factors.  She is tried to look at her food and nutrition, stress, sleep and other factors but nothing seem to have changed her symptoms.  For 2 years she started making some homemade protein bars using Chia seed, flaxseed, coconut, oats, dark chocolate, honey and peanut butter.  She was eating a fairly substantial  portion of this every morning in place of a meal and then her normal meals later in the day.  During this 2-year timeframe her symptoms seem to have resolved.  Then she started cutting down on her protein bar intake and her symptoms seem to return.  She denies any confusion, dj vu, change in time perception, out of body sensation or convulsions.  No olfactory hallucinations.  No other abnormal spells to suggest seizures or TIA.  He has been to GI specialist twice and recently had endoscopy and colonoscopy procedures with biopsies, results are pending.  Patient has had some issues with mild constipation tendency sometimes going 3 to 4 days without bowel movement.  She has tried to increase hydration and fiber intake to help with this.  Recently she has been using MiraLAX as needed and symptoms seem to be better.   REVIEW OF SYSTEMS: Full 14 system review of systems performed and negative with exception of: as per HPI.  ALLERGIES: Allergies[1]  HOME MEDICATIONS: Outpatient Medications Prior to Visit  Medication Sig Dispense Refill   levothyroxine (SYNTHROID) 50 MCG tablet Take 50 mcg by mouth daily.     lisinopril (ZESTRIL) 10 MG tablet Take 10 mg by mouth daily.     No facility-administered medications prior to visit.    PAST MEDICAL HISTORY: Past Medical History:  Diagnosis Date   HTN (hypertension)    Hypothyroidism  OSA (obstructive sleep apnea)     PAST SURGICAL HISTORY: History reviewed. No pertinent surgical history.  FAMILY HISTORY: Family History  Problem Relation Age of Onset   Cancer Mother        possible breast cancer   Cancer Maternal Grandmother        possible breast cancer   Migraines Neg Hx    Seizures Neg Hx    Stroke Neg Hx    Sleep apnea Neg Hx     SOCIAL HISTORY: Social History   Socioeconomic History   Marital status: Divorced    Spouse name: Not on file   Number of children: 1   Years of education: Not on file   Highest education level:  Not on file  Occupational History   Not on file  Tobacco Use   Smoking status: Never   Smokeless tobacco: Never  Substance and Sexual Activity   Alcohol use: Never   Drug use: Never   Sexual activity: Not on file  Other Topics Concern   Not on file  Social History Narrative   No coffee, some un-sweet tea with stevia, and some diet soda 1 time weekly    Social Drivers of Health   Tobacco Use: Low Risk (08/22/2024)   Patient History    Smoking Tobacco Use: Never    Smokeless Tobacco Use: Never    Passive Exposure: Not on file  Financial Resource Strain: Not on file  Food Insecurity: No Food Insecurity (06/13/2024)   Epic    Worried About Programme Researcher, Broadcasting/film/video in the Last Year: Never true    Ran Out of Food in the Last Year: Never true  Transportation Needs: No Transportation Needs (06/13/2024)   Epic    Lack of Transportation (Medical): No    Lack of Transportation (Non-Medical): No  Physical Activity: Not on file  Stress: Not on file  Social Connections: Not on file  Intimate Partner Violence: Not At Risk (06/13/2024)   Epic    Fear of Current or Ex-Partner: No    Emotionally Abused: No    Physically Abused: No    Sexually Abused: No  Depression (PHQ2-9): Low Risk (06/13/2024)   Depression (PHQ2-9)    PHQ-2 Score: 2  Alcohol Screen: Not on file  Housing: Low Risk (06/13/2024)   Epic    Unable to Pay for Housing in the Last Year: No    Number of Times Moved in the Last Year: 0    Homeless in the Last Year: No  Utilities: Not At Risk (06/13/2024)   Epic    Threatened with loss of utilities: No  Health Literacy: Not on file     PHYSICAL EXAM  GENERAL EXAM/CONSTITUTIONAL: Vitals:  Vitals:   08/22/24 1353  BP: 120/74  Pulse: 74  SpO2: 99%  Weight: 190 lb (86.2 kg)  Height: 5' 4 (1.626 m)   Body mass index is 32.61 kg/m. Wt Readings from Last 3 Encounters:  08/22/24 190 lb (86.2 kg)  06/13/24 198 lb 3.2 oz (89.9 kg)  03/03/20 190 lb (86.2 kg)    Patient is in no distress; well developed, nourished and groomed; neck is supple  CARDIOVASCULAR: Examination of carotid arteries is normal; no carotid bruits Regular rate and rhythm, no murmurs Examination of peripheral vascular system by observation and palpation is normal  EYES: Ophthalmoscopic exam of optic discs and posterior segments is normal; no papilledema or hemorrhages No results found.  MUSCULOSKELETAL: Gait, strength, tone, movements noted in Neurologic exam below  NEUROLOGIC: MENTAL STATUS:      No data to display         awake, alert, oriented to person, place and time recent and remote memory intact normal attention and concentration language fluent, comprehension intact, naming intact fund of knowledge appropriate  CRANIAL NERVE:  2nd - no papilledema on fundoscopic exam 2nd, 3rd, 4th, 6th - pupils equal and reactive to light, visual fields full to confrontation, extraocular muscles intact, no nystagmus 5th - facial sensation symmetric 7th - facial strength symmetric 8th - hearing intact 9th - palate elevates symmetrically, uvula midline 11th - shoulder shrug symmetric 12th - tongue protrusion midline  MOTOR:  normal bulk and tone, full strength in the BUE, BLE  SENSORY:  normal and symmetric to light touch, temperature, vibration  COORDINATION:  finger-nose-finger, fine finger movements normal  REFLEXES:  deep tendon reflexes present and symmetric  GAIT/STATION:  narrow based gait     DIAGNOSTIC DATA (LABS, IMAGING, TESTING) - I reviewed patient records, labs, notes, testing and imaging myself where available.  Lab Results  Component Value Date   WBC 27.7 (H) 03/03/2020   HGB 17.3 (H) 03/03/2020   HCT 52.4 (H) 03/03/2020   MCV 87.3 03/03/2020   PLT 443 (H) 03/03/2020      Component Value Date/Time   NA 139 03/03/2020 2242   K 4.1 03/03/2020 2242   CL 102 03/03/2020 2242   CO2 22 03/03/2020 2242   GLUCOSE 194 (H) 03/03/2020  2242   BUN 26 (H) 03/03/2020 2242   CREATININE 1.35 (H) 03/03/2020 2242   CALCIUM 9.6 03/03/2020 2242   PROT 7.5 03/03/2020 2242   ALBUMIN 4.3 03/03/2020 2242   AST 28 03/03/2020 2242   ALT 17 03/03/2020 2242   ALKPHOS 85 03/03/2020 2242   BILITOT 1.6 (H) 03/03/2020 2242   GFRNONAA 44 (L) 03/03/2020 2242   GFRAA 51 (L) 03/03/2020 2242   No results found for: CHOL, HDL, LDLCALC, LDLDIRECT, TRIG, CHOLHDL No results found for: YHAJ8R No results found for: VITAMINB12 No results found for: TSH    ASSESSMENT AND PLAN  58 y.o. year old female here with:  Dx:  1. Spell of abnormal behavior     PLAN:  INTERMITTENT DIARRHEA / VOMITING SPELLS (every ~9 months; since ~2008; no clear underlying cause so far but could represent GI issue related to constipation or bowel habits.  Will proceed with further workup to rule out other neurologic causes of dysautonomia or seizure disorder) - check MRI brain (rule out CNS causes), EEG (rule out autonomic seizure spells)  Orders Placed This Encounter  Procedures   MR BRAIN W WO CONTRAST   EEG adult   Return for pending if symptoms worsen or fail to improve, pending test results.    EDUARD FABIENE HANLON, MD 08/22/2024, 2:54 PM Certified in Neurology, Neurophysiology and Neuroimaging  [facility] [address] [place], [place] [address] [phone]     [1]  Allergies Allergen Reactions   Amoxicillin Rash   "

## 2024-08-22 NOTE — Patient Instructions (Signed)
" °  INTERMITTENT DIARRHEA / VOMITING SPELLS (every ~9 months; since ~2008) - check MRI brain (rule out CNS causes), EEG (rule out autonomic seizure spells) "

## 2024-08-30 ENCOUNTER — Ambulatory Visit: Admitting: *Deleted

## 2024-08-30 DIAGNOSIS — R4182 Altered mental status, unspecified: Secondary | ICD-10-CM

## 2024-08-30 DIAGNOSIS — R4689 Other symptoms and signs involving appearance and behavior: Secondary | ICD-10-CM

## 2024-08-31 ENCOUNTER — Ambulatory Visit

## 2024-08-31 DIAGNOSIS — R4689 Other symptoms and signs involving appearance and behavior: Secondary | ICD-10-CM | POA: Diagnosis not present

## 2024-08-31 MED ORDER — GADOBENATE DIMEGLUMINE 529 MG/ML IV SOLN
15.0000 mL | Freq: Once | INTRAVENOUS | Status: AC | PRN
  Administered 2024-08-31: 15 mL via INTRAVENOUS

## 2024-09-04 ENCOUNTER — Ambulatory Visit: Payer: Self-pay | Admitting: Diagnostic Neuroimaging

## 2024-09-27 NOTE — Procedures (Signed)
"  ° °  [facility]  EEG (ELECTROENCEPHALOGRAM) REPORT   STUDY DATE: 09/27/24 PATIENT NAME: Nancy Kim DOB: 01/14/1966 MRN: [id number]  ORDERING CLINICIAN: Eduard Hanlon, MD   TECHNOLOGIST: MARLA Plummer TECHNIQUE: Electroencephalogram was recorded utilizing standard 10-20 system of lead placement and reformatted into average and bipolar montages.  RECORDING TIME: 26 minutes ACTIVATION: hyperventilation and photic stimulation  CLINICAL INFORMATION: 59 year old female with abnormal spell.  FINDINGS: Posterior dominant background rhythms, which attenuate with eye opening, ranging 9-10 hertz and 30-40 microvolts. No focal, lateralizing, epileptiform activity or seizures are seen. Patient recorded in the awake and drowsy state. EKG channel shows regular rhythm of 60-65 beats per minute.   IMPRESSION:   Normal EEG in the awake and drowsy states.    INTERPRETING PHYSICIAN:  EDUARD FABIENE HANLON, MD Certified in Neurology, Neurophysiology and Neuroimaging  [facility] [address], [address] [place], [place] [address] [phone]  "

## 2024-10-19 ENCOUNTER — Ambulatory Visit: Admitting: Diagnostic Neuroimaging

## 2024-10-27 ENCOUNTER — Ambulatory Visit: Admitting: Diagnostic Neuroimaging

## 2024-12-14 ENCOUNTER — Ambulatory Visit: Admitting: Diagnostic Neuroimaging
